# Patient Record
Sex: Male | Born: 1995
Health system: Southern US, Community
[De-identification: ages and names within clinical notes are randomized; demographics above are authoritative.]

---
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
## 2014-03-24 ENCOUNTER — Encounter (HOSPITAL_COMMUNITY): Payer: Self-pay | Admitting: Emergency Medicine

## 2014-03-24 ENCOUNTER — Emergency Department (HOSPITAL_COMMUNITY): Payer: Self-pay

## 2014-03-24 ENCOUNTER — Emergency Department (HOSPITAL_COMMUNITY)
Admission: EM | Admit: 2014-03-24 | Discharge: 2014-03-25 | Disposition: A | Discharge status: Home / Self Care | Payer: Self-pay | Attending: Emergency Medicine | Admitting: Emergency Medicine

## 2014-03-24 DIAGNOSIS — R0789 Other chest pain: Secondary | ICD-10-CM

## 2014-03-24 DIAGNOSIS — R071 Chest pain on breathing: Secondary | ICD-10-CM | POA: Insufficient documentation

## 2014-03-24 DIAGNOSIS — R079 Chest pain, unspecified: Secondary | ICD-10-CM | POA: Insufficient documentation

## 2014-03-24 LAB — CBC WITH DIFFERENTIAL/PLATELET
BASOS ABS: 0 10*3/uL (ref 0.0–0.1)
BASOS PCT: 0 % (ref 0–1)
EOS ABS: 0.1 10*3/uL (ref 0.0–0.7)
Eosinophils Relative: 1 % (ref 0–5)
HCT: 42 % (ref 39.0–52.0)
Hemoglobin: 14.5 g/dL (ref 13.0–17.0)
Lymphocytes Relative: 29 % (ref 12–46)
Lymphs Abs: 2 10*3/uL (ref 0.7–4.0)
MCH: 29.6 pg (ref 26.0–34.0)
MCHC: 34.5 g/dL (ref 30.0–36.0)
MCV: 85.7 fL (ref 78.0–100.0)
Monocytes Absolute: 0.6 10*3/uL (ref 0.1–1.0)
Monocytes Relative: 9 % (ref 3–12)
NEUTROS ABS: 4.3 10*3/uL (ref 1.7–7.7)
NEUTROS PCT: 61 % (ref 43–77)
Platelets: 249 10*3/uL (ref 150–400)
RBC: 4.9 MIL/uL (ref 4.22–5.81)
RDW: 12.4 % (ref 11.5–15.5)
WBC: 7 10*3/uL (ref 4.0–10.5)

## 2014-03-24 LAB — BASIC METABOLIC PANEL
ANION GAP: 12 (ref 5–15)
BUN: 16 mg/dL (ref 6–23)
CHLORIDE: 101 meq/L (ref 96–112)
CO2: 27 mEq/L (ref 19–32)
CREATININE: 0.73 mg/dL (ref 0.50–1.35)
Calcium: 9.5 mg/dL (ref 8.4–10.5)
Glucose, Bld: 102 mg/dL — ABNORMAL HIGH (ref 70–99)
POTASSIUM: 4 meq/L (ref 3.7–5.3)
Sodium: 140 mEq/L (ref 137–147)

## 2014-03-24 LAB — I-STAT TROPONIN, ED: Troponin i, poc: 0 ng/mL (ref 0.00–0.08)

## 2014-03-24 NOTE — ED Notes (Addendum)
Pt presents with chest pain that has been intermittent for the past year, reports some shortness of breath when he is relzxing as well.  Pain is in center of chest onset at present, denies radiation, denies N/V.  Lung sounds clear-  No distress noted in triage.

## 2014-03-25 MED ORDER — ACETAMINOPHEN 325 MG PO TABS
650.0000 mg | ORAL_TABLET | Freq: Once | ORAL | Status: AC
  Administered 2014-03-25: 650 mg via ORAL
  Filled 2014-03-25: qty 2

## 2014-03-25 MED ORDER — GI COCKTAIL ~~LOC~~: 30.0000 mL | Freq: Once | ORAL | Status: DC

## 2014-03-25 NOTE — ED Notes (Signed)
Patient is alert and orientedx4.  Patient was explained discharge instructions and they understood them with no questions.  The patient's mother, Rupert Stackslisa Langworthy is taking the patient home.

## 2014-03-25 NOTE — ED Provider Notes (Signed)
Medical screening examination/treatment/procedure(s) were conducted as a shared visit with non-physician practitioner(s) and myself.  I personally evaluated the patient during the encounter.   EKG Interpretation   Date/Time:  Thursday March 24 2014 20:28:38 EDT Ventricular Rate:  77 PR Interval:  140 QRS Duration: 86 QT Interval:  340 QTC Calculation: 384 R Axis:   99 Text Interpretation:  Normal sinus rhythm with sinus arrhythmia Rightward  axis Borderline ECG Confirmed by Jodi MourningZAVITZ  MD, JOSHUA ([id number]) on 03/24/2014  11:08:42 PM       Doug SouSam Taji Sather, MD 03/25/14 [id number]

## 2014-03-25 NOTE — ED Provider Notes (Signed)
CSN: [id number]     Arrival date & time 03/24/14  2024 History   First MD Initiated Contact with Patient 03/24/14 2346     Chief Complaint  Patient presents with  . Chest Pain     (Consider location/radiation/quality/duration/timing/severity/associated sxs/prior Treatment) HPI  Patient to the ER for evaluation of chest pains that have been coming and going over the past year. He reports waking up this morning with the pain and continued to have it throughout the day. The pain worsens with movement, big breaths and pressure. 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 He has no hx of cardiac dx and is unsure of family history. He feels the pain right under his sternum. He admits to eating a lot of spicy foods. He still reports having mild pain right now in the ED.  History reviewed. No pertinent past medical history. History reviewed. No pertinent past surgical history. No family history on file. History  Substance Use Topics  . Smoking status: Never Smoker   . Smokeless tobacco: Not on file  . Alcohol Use: No    Review of Systems   Review of Systems  Gen: no weight loss, fevers, chills, night sweats  Eyes: no occular draining, occular pain,  No visual changes  Nose: no epistaxis or rhinorrhea  Mouth: no dental pain, no sore throat  Neck: no neck pain  Lungs: No hemoptysis. No wheezing or coughing CV:  No palpitations, dependent edema or orthopnea. + chest pain Abd: no diarrhea. No nausea or vomiting, No abdominal pain  GU: no dysuria or gross hematuria  MSK:  No muscle weakness, No muscular pain Neuro: no headache, no focal neurologic deficits  Skin: no rash , no wounds Psyche: no complaints of depression or anxiety    Allergies  Review of patient's allergies indicates no known allergies.  Home Medications   Prior to Admission medications   Not on File   BP 132/82  Pulse 82  Temp(Src) 98.1 F (36.7 C) (Oral)  Resp  17  Ht 5\' 2"  (1.575 m)  Wt 160 lb (72.576 kg)  BMI 29.26 kg/m2  SpO2 99% Physical Exam  Nursing note and vitals reviewed. Constitutional: He appears well-developed and well-nourished. No distress.  HENT:  Head: Normocephalic and atraumatic.  Eyes: Pupils are equal, round, and reactive to light.  Neck: Normal range of motion. Neck supple.  Cardiovascular: Normal rate and regular rhythm.   Pulmonary/Chest: Effort normal and breath sounds normal. He has no wheezes. He has no rhonchi. He exhibits tenderness (tenderness to palpation of chest wall). He exhibits no bony tenderness and no laceration.    Abdominal: Soft.  Neurological: He is alert.  Skin: Skin is warm and dry.      ED Course  Procedures (including critical care time) Labs Review Labs Reviewed  BASIC METABOLIC PANEL - Abnormal; Notable for the following:    Glucose, Bld 102 (*)    All other components within normal limits  CBC WITH DIFFERENTIAL  Rosezena Sensor, ED    Imaging Review Dg Chest 2 View  03/24/2014   CLINICAL DATA:  Chest pain.  EXAM: CHEST  2 VIEW  COMPARISON:  None.  FINDINGS: The heart size and mediastinal contours are within normal limits. Both lungs are clear. The visualized skeletal structures are unremarkable.  IMPRESSION: No acute cardiopulmonary findings.   Electronically Signed   By: Loralie Champagne M.D.   On: 03/24/2014 22:12     EKG  Interpretation   Date/Time:  Thursday March 24 2014 20:28:38 EDT Ventricular Rate:  77 PR Interval:  140 QRS Duration: 86 QT Interval:  340 QTC Calculation: 384 R Axis:   99 Text Interpretation:  Normal sinus rhythm with sinus arrhythmia Rightward  axis Borderline ECG Confirmed by ZAVITZ  MD, JOSHUA ([id number]) on 03/24/2014  11:08:42 PM      MDM   Final diagnoses:  Chest wall pain    Patient is otherwise a young and healthy male with no cardiac hx. He denies smoking and does not have any cardiac risk factors. He had a chest xray, Troponin, EKG, CBC,  and BMP completed. These showed no abnormal findings to warrant concern for ACS or any other cardiopulmonary etiology. It seems most likely muscular but acid reflux is also a possability. He was given Tylenol in the ED. Dr. Ethelda ChickJacubowitz feels that this is musculoskeletal in nature.   Given [facility]  18 y.o.Austin Mcfarland's evaluation in the Emergency Department is complete. It has been determined that no acute conditions requiring further emergency intervention are present at this time. The patient/guardian have been advised of the diagnosis and plan. We have discussed signs and symptoms that warrant return to the ED, such as changes or worsening in symptoms.  Vital signs are stable at discharge. Filed Vitals:   03/24/14 2032  BP: 132/82  Pulse: 82  Temp: 98.1 F (36.7 C)  Resp: 17    Patient/guardian has voiced understanding and agreed to follow-up with the PCP or specialist.     Dorthula Matasiffany G Prem Coykendall, PA-C 03/25/14 0021

## 2014-03-25 NOTE — ED Provider Notes (Signed)
Complains of anterior chest pain onset one year ago. Became worse today pain is worse when he lifts his arms above his head nonexertional. No shortness of breath nausea or sweatiness. Cardiac risk factors none. Patient alert no distress lungs clear auscultation heart regular rate and rhythm no murmurs rubs chest is tender at left sternal border. Pain is reproducible by forcible abduction of left shoulder. Medical decision-making Symptoms and exam consistent with chest wall pain. Patient has perked negative heart score 0.  chext xray viewed by me. Results for orders placed during the hospital encounter of 03/24/14  CBC WITH DIFFERENTIAL      Result Value Ref Range   WBC 7.0  4.0 - 10.5 K/uL   RBC 4.90  4.22 - 5.81 MIL/uL   Hemoglobin 14.5  13.0 - 17.0 g/dL   HCT 01.042.0  27.239.0 - 53.652.0 %   MCV 85.7  78.0 - 100.0 fL   MCH 29.6  26.0 - 34.0 pg   MCHC 34.5  30.0 - 36.0 g/dL   RDW 64.412.4  03.411.5 - 74.215.5 %   Platelets 249  150 - 400 K/uL   Neutrophils Relative % 61  43 - 77 %   Neutro Abs 4.3  1.7 - 7.7 K/uL   Lymphocytes Relative 29  12 - 46 %   Lymphs Abs 2.0  0.7 - 4.0 K/uL   Monocytes Relative 9  3 - 12 %   Monocytes Absolute 0.6  0.1 - 1.0 K/uL   Eosinophils Relative 1  0 - 5 %   Eosinophils Absolute 0.1  0.0 - 0.7 K/uL   Basophils Relative 0  0 - 1 %   Basophils Absolute 0.0  0.0 - 0.1 K/uL  BASIC METABOLIC PANEL      Result Value Ref Range   Sodium 140  137 - 147 mEq/L   Potassium 4.0  3.7 - 5.3 mEq/L   Chloride 101  96 - 112 mEq/L   CO2 27  19 - 32 mEq/L   Glucose, Bld 102 (*) 70 - 99 mg/dL   BUN 16  6 - 23 mg/dL   Creatinine, Ser 5.950.73  0.50 - 1.35 mg/dL   Calcium 9.5  8.4 - 63.810.5 mg/dL   GFR calc non Af Amer >90  >90 mL/min   GFR calc Af Amer >90  >90 mL/min   Anion gap 12  5 - 15  I-STAT TROPOININ, ED      Result Value Ref Range   Troponin i, poc 0.00  0.00 - 0.08 ng/mL   Comment 3            Dg Chest 2 View  03/24/2014   CLINICAL DATA:  Chest pain.  EXAM: CHEST  2 VIEW   COMPARISON:  None.  FINDINGS: The heart size and mediastinal contours are within normal limits. Both lungs are clear. The visualized skeletal structures are unremarkable.  IMPRESSION: No acute cardiopulmonary findings.   Electronically Signed   By: Loralie ChampagneMark  Gallerani M.D.   On: 03/24/2014 22:12     Doug SouSam Anaclara Acklin, MD 03/25/14 0021

## 2014-03-25 NOTE — Discharge Instructions (Signed)

## 2014-07-25 ENCOUNTER — Emergency Department (HOSPITAL_COMMUNITY): Payer: Self-pay

## 2014-07-25 ENCOUNTER — Emergency Department (HOSPITAL_COMMUNITY)
Admission: EM | Admit: 2014-07-25 | Discharge: 2014-07-25 | Disposition: A | Discharge status: Home / Self Care | Payer: Self-pay | Attending: Emergency Medicine | Admitting: Emergency Medicine

## 2014-07-25 ENCOUNTER — Encounter (HOSPITAL_COMMUNITY): Payer: Self-pay | Admitting: Emergency Medicine

## 2014-07-25 DIAGNOSIS — R52 Pain, unspecified: Secondary | ICD-10-CM

## 2014-07-25 DIAGNOSIS — S3992XA Unspecified injury of lower back, initial encounter: Secondary | ICD-10-CM | POA: Insufficient documentation

## 2014-07-25 DIAGNOSIS — Y9389 Activity, other specified: Secondary | ICD-10-CM | POA: Insufficient documentation

## 2014-07-25 DIAGNOSIS — T148XXA Other injury of unspecified body region, initial encounter: Secondary | ICD-10-CM

## 2014-07-25 DIAGNOSIS — Y9289 Other specified places as the place of occurrence of the external cause: Secondary | ICD-10-CM | POA: Insufficient documentation

## 2014-07-25 DIAGNOSIS — S90512A Abrasion, left ankle, initial encounter: Secondary | ICD-10-CM | POA: Insufficient documentation

## 2014-07-25 DIAGNOSIS — S8012XA Contusion of left lower leg, initial encounter: Secondary | ICD-10-CM | POA: Insufficient documentation

## 2014-07-25 DIAGNOSIS — W102XXA Fall (on)(from) incline, initial encounter: Secondary | ICD-10-CM

## 2014-07-25 DIAGNOSIS — Y990 Civilian activity done for income or pay: Secondary | ICD-10-CM | POA: Insufficient documentation

## 2014-07-25 MED ORDER — HYDROCODONE-ACETAMINOPHEN 5-325 MG PO TABS
1.0000 | ORAL_TABLET | Freq: Once | ORAL | Status: AC
  Administered 2014-07-25: 1 via ORAL
  Filled 2014-07-25: qty 1

## 2014-07-25 MED ORDER — TETANUS-DIPHTH-ACELL PERTUSSIS 5-2.5-18.5 LF-MCG/0.5 IM SUSP
0.5000 mL | Freq: Once | INTRAMUSCULAR | Status: AC
  Administered 2014-07-25: 0.5 mL via INTRAMUSCULAR
  Filled 2014-07-25: qty 0.5

## 2014-07-25 MED ORDER — IBUPROFEN 800 MG PO TABS: 800.0000 mg | ORAL_TABLET | Freq: Three times a day (TID) | ORAL | Status: DC | PRN

## 2014-07-25 MED ORDER — HYDROCODONE-ACETAMINOPHEN 5-325 MG PO TABS: 1.0000 | ORAL_TABLET | ORAL | Status: DC | PRN

## 2014-07-25 NOTE — ED Provider Notes (Signed)
CSN: [id number]     Arrival date & time 07/25/14  1949 History  This chart was scribed for non-physician practitioner working with Audree CamelScott T Goldston, MD by Elveria Risingimelie Horne, ED Scribe. This patient was seen in room TR05C/TR05C and the patient's care was started at 9:02 PM.   Chief Complaint  Patient presents with  . Fall   The history is provided by the patient. No language interpreter was used.   HPI Comments: Austin Mcfarland is a 18 y.o. male who presents to the Emergency Department after a fall on the job this morning. 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 Patient reports that their was small space between the truck and the dock that he fell into. Patient reports hitting his left shin, trapping it in the small space, catching himself with his right leg and hitting his back on a metal railing. Patient denies head injury or loss of consciousness or additional injuries. Patient states that he continued working after his fall. Patient is now complaining of lower back pain and left shin pain. Patient reports bruising, abrasion, and severe pain to his left shin. Patient currently rates his shin pain at 10/10 and his back pain at 8/10.  Shin pain is sharp, constant, nonradiating.  Back pain is sore, constant, worse with movement and palpation, nonradiating.  Denies loss of control of bowel or bladder, weakness of numbness of the extremities, saddle anesthesia, abdominal pain.  History reviewed. No pertinent past medical history. History reviewed. No pertinent past surgical history. No family history on file. History  Substance Use Topics  . Smoking status: Never Smoker   . Smokeless tobacco: Not on file  . Alcohol Use: No    Review of Systems  Constitutional: Negative for fever and chills.  Respiratory: Negative for cough and shortness of breath.   Cardiovascular: Negative for chest pain.  Gastrointestinal: Negative for nausea, vomiting and diarrhea.   Genitourinary: Negative for dysuria and hematuria.  Musculoskeletal: Positive for back pain and arthralgias.  Neurological: Negative for weakness and numbness.   Allergies  Review of patient's allergies indicates no known allergies.  Home Medications   Prior to Admission medications   Not on File   Triage Vitals: BP 123/66 mmHg  Pulse 72  Temp(Src) 98.6 F (37 C) (Oral)  Resp 18  SpO2 100% Physical Exam  Constitutional: He appears well-developed and well-nourished. No distress.  HENT:  Head: Normocephalic and atraumatic.  Neck: Neck supple.  Pulmonary/Chest: Effort normal.  Abdominal: Soft. He exhibits no distension. There is no tenderness. There is no rebound and no guarding.  Musculoskeletal:  Spine nontender with exception of lumbar sine, no crepitus, or stepoffs. Midline tenderness to lumbar spine. No Crepitus or step offs.  Lower extremities:  Strength 5/5, sensation intact, distal pulses intact.    Normal active ROM of left ankle left toes. Distal sensation intact. No bony tenderness to foot or ankle. Abrasion to the medial ankle with surrounding ecchymosis. Contusion approximately 8 x 3cm over his shin without surrounding mild ecchymosis and tenderness throughout mid anterior shin.   Neurological: He is alert.  Skin: He is not diaphoretic.  Nursing note and vitals reviewed.   ED Course  Procedures (including critical care time)  COORDINATION OF CARE: 9:02 PM- Discussed treatment plan with patient at bedside and patient agreed to plan.   Labs Review Labs Reviewed - No data to display  Imaging Review Dg Lumbar Spine Complete  07/25/2014  CLINICAL DATA:  Initial evaluation for acute trauma. Fall. Lower back pain.  EXAM: LUMBAR SPINE - COMPLETE 4+ VIEW  COMPARISON:  Prior study from 03/28/2011  FINDINGS: Extent distal lumbosacral anatomy noted. Vertebral bodies are normally aligned with preservation of the normal lumbar lordosis. Vertebral body heights maintained.  No acute fracture or listhesis.  No significant degenerative changes.  No paraspinous soft tissue abnormality.  IMPRESSION: No acute traumatic injury within the lumbar spine.   Electronically Signed   By: Rise MuBenjamin  McClintock M.D.   On: 07/25/2014 22:34   Dg Tibia/fibula Left  07/25/2014   CLINICAL DATA:  Fall today. Left anterior lower leg pain with contusion.  EXAM: LEFT TIBIA AND FIBULA - 2 VIEW  COMPARISON:  None.  FINDINGS: The mineralization and alignment are normal. There is no evidence of acute fracture or dislocation. There appears to be mild pretibial soft tissue swelling. No foreign bodies or soft tissue emphysema demonstrated.  IMPRESSION: Mild pretibial soft tissue swelling.  No acute osseous findings.   Electronically Signed   By: Roxy HorsemanBill  Veazey M.D.   On: 07/25/2014 22:28     EKG Interpretation None      MDM   Final diagnoses:  Pain  Contusion  Fall (on)(from) incline, initial encounter    Afebrile, nontoxic patient with hx fall this morning with contusion of left shin, pain in low back as well.  No red flags for back pain.  Xrays negative.   D/C home with ibuprofen, norco, ace wrap, RICE tx recommendations.  Discussed result, findings, treatment, and follow up  with patient.  Pt given return precautions.  Pt verbalizes understanding and agrees with plan.       I personally performed the services described in this documentation, which was scribed in my presence. The recorded information has been reviewed and is accurate.    Trixie Dredgemily Addilynn Mowrer, PA-C 07/25/14 2250  Audree CamelScott T Goldston, MD 07/26/14 [phone]

## 2014-07-25 NOTE — ED Notes (Signed)
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

## 2014-07-25 NOTE — Discharge Instructions (Signed)
Read the information below.  Use the prescribed medication as directed.  Please discuss all new medications with your pharmacist.  Do not take additional tylenol while taking the prescribed pain medication to avoid overdose.  You may return to the Emergency Department at any time for worsening condition or any new symptoms that concern you.  If you develop uncontrolled pain, weakness or numbness of the extremity, severe discoloration of the skin, or you are unable to walk, return to the ER for a recheck.      Contusion A contusion is the result of an injury to the skin and underlying tissues and is usually caused by direct trauma. The injury results in the appearance of a bruise on the skin overlying the injured tissues. Contusions cause rupture and bleeding of the small capillaries and blood vessels and affect function, because the bleeding infiltrates muscles, tendons, nerves, or other soft tissues.  SYMPTOMS   Swelling and often a hard lump in the injured area, either superficial or deep.  Pain and tenderness over the area of the contusion.  Feeling of firmness when pressure is exerted over the contusion.  Discoloration under the skin, beginning with redness and progressing to the characteristic "black and blue" bruise. CAUSES  A contusion is typically the result of direct trauma. This is often by a blunt object.  RISK INCREASES WITH:  Sports that have a high likelihood of trauma (football, boxing, ice hockey, soccer, field hockey, martial arts, basketball, and baseball).  Sports that make falling from a height likely (high-jumping, pole-vaulting, skating, or gymnastics).  Any bleeding disorder (hemophilia) or taking medications that affect clotting (aspirin, nonsteroidal anti-inflammatory medications, or warfarin [Coumadin]).  Inadequate protection of exposed areas during contact sports. PREVENTION  Maintain physical fitness:  Joint and muscle flexibility.  Strength and  endurance.  Coordination.  Wear proper protective equipment. Make sure it fits correctly. PROGNOSIS  Contusions typically heal without any complications. Healing time varies with the severity of injury and intake of medications that affect clotting. Contusions usually heal in 1 to 4 weeks. RELATED COMPLICATIONS   Damage to nearby nerves or blood vessels, causing numbness, coldness, or paleness.  Compartment syndrome.  Bleeding into the soft tissues that leads to disability.  Infiltrative-type bleeding, leading to the calcification and impaired function of the injured muscle (rare).  Prolonged healing time if usual activities are resumed too soon.  Infection if the skin over the injury site is broken.  Fracture of the bone underlying the contusion.  Stiffness in the joint where the injured muscle crosses. TREATMENT  Treatment initially consists of resting the injured area as well as medication and ice to reduce inflammation. The use of a compression bandage may also be helpful in minimizing inflammation. As pain diminishes and movement is tolerated, the joint where the affected muscle crosses should be moved to prevent stiffness and the shortening (contracture) of the joint. Movement of the joint should begin as soon as possible. It is also important to work on maintaining strength within the affected muscles. Occasionally, extra padding over the area of contusion may be recommended before returning to sports, particularly if re-injury is likely.  MEDICATION   If pain relief is necessary these medications are often recommended:  Nonsteroidal anti-inflammatory medications, such as aspirin and ibuprofen.  Other minor pain relievers, such as acetaminophen, are often recommended.  Prescription pain relievers may be given by your caregiver. Use only as directed and only as much as you need. HEAT AND COLD  Cold treatment (  icing) relieves pain and reduces inflammation. Cold treatment  should be applied for 10 to 15 minutes every 2 to 3 hours for inflammation and pain and immediately after any activity that aggravates your symptoms. Use ice packs or an ice massage. (To do an ice massage fill a large styrofoam cup with water and freeze. Tear a small amount of foam from the top so ice protrudes. Massage ice firmly over the injured area in a circle about the size of a softball.)  Heat treatment may be used prior to performing the stretching and strengthening activities prescribed by your caregiver, physical therapist, or athletic trainer. Use a heat pack or a warm soak. SEEK MEDICAL CARE IF:   Symptoms get worse or do not improve despite treatment in a few days.  You have difficulty moving a joint.  Any extremity becomes extremely painful, numb, pale, or cool (This is an emergency!).  Medication produces any side effects (bleeding, upset stomach, or allergic reaction).  Signs of infection (drainage from skin, headache, muscle aches, dizziness, fever, or general ill feeling) occur if skin was broken. Document Released: 07/22/2005 Document Revised: 10/14/2011 Document Reviewed: 11/03/2008 [facility] Patient Information 2015 [place]. This information is not intended to replace advice given to you by your health care provider. Make sure you discuss any questions you have with your health care provider.

## 2015-09-13 ENCOUNTER — Encounter (HOSPITAL_COMMUNITY): Payer: Self-pay | Admitting: Family Medicine

## 2015-09-13 ENCOUNTER — Emergency Department (HOSPITAL_COMMUNITY)
Admission: EM | Admit: 2015-09-13 | Discharge: 2015-09-13 | Disposition: A | Discharge status: Home / Self Care | Payer: Self-pay | Attending: Emergency Medicine | Admitting: Emergency Medicine

## 2015-09-13 DIAGNOSIS — M62830 Muscle spasm of back: Secondary | ICD-10-CM | POA: Insufficient documentation

## 2015-09-13 DIAGNOSIS — M25562 Pain in left knee: Secondary | ICD-10-CM | POA: Insufficient documentation

## 2015-09-13 DIAGNOSIS — M62838 Other muscle spasm: Secondary | ICD-10-CM

## 2015-09-13 DIAGNOSIS — Z87828 Personal history of other (healed) physical injury and trauma: Secondary | ICD-10-CM | POA: Insufficient documentation

## 2015-09-13 MED ORDER — CYCLOBENZAPRINE HCL 10 MG PO TABS: 10.0000 mg | ORAL_TABLET | Freq: Two times a day (BID) | ORAL | Status: DC | PRN

## 2015-09-13 MED ORDER — IBUPROFEN 800 MG PO TABS: 800.0000 mg | ORAL_TABLET | Freq: Three times a day (TID) | ORAL | Status: DC

## 2015-09-13 MED FILL — IBUPROFEN 800 MG TABLET: 800 | 7 days supply | Qty: 21 | Fill #0

## 2015-09-13 MED FILL — CYCLOBENZAPRINE 10 MG TAB: 10 | 10 days supply | Qty: 20 | Fill #0

## 2015-09-13 NOTE — ED Notes (Signed)
Pt here for lower back pain. sts he has had 2 injuries at work and then became worse after playing soccer the other day.

## 2015-09-13 NOTE — Discharge Instructions (Signed)
Lumbosacral Strain Lumbosacral strain is a strain of any of the parts that make up your lumbosacral vertebrae. Your lumbosacral vertebrae are the bones that make up the lower third of your backbone. Your lumbosacral vertebrae are held together by muscles and tough, fibrous tissue (ligaments).  CAUSES  A sudden blow to your back can cause lumbosacral strain. Also, anything that causes an excessive stretch of the muscles in the low back can cause this strain. This is typically seen when people exert themselves strenuously, fall, lift heavy objects, bend, or crouch repeatedly. RISK FACTORS  Physically demanding work.  Participation in pushing or pulling sports or sports that require a sudden twist of the back (tennis, golf, baseball).  Weight lifting.  Excessive lower back curvature.  Forward-tilted pelvis.  Weak back or abdominal muscles or both.  Tight hamstrings. SIGNS AND SYMPTOMS  Lumbosacral strain may cause pain in the area of your injury or pain that moves (radiates) down your leg.  DIAGNOSIS Your health care provider can often diagnose lumbosacral strain through a physical exam. In some cases, you may need tests such as X-ray exams.  TREATMENT  Treatment for your lower back injury depends on many factors that your clinician will have to evaluate. However, most treatment will include the use of anti-inflammatory medicines. HOME CARE INSTRUCTIONS   Avoid hard physical activities (tennis, racquetball, waterskiing) if you are not in proper physical condition for it. This may aggravate or create problems.  If you have a back problem, avoid sports requiring sudden body movements. Swimming and walking are generally safer activities.  Maintain good posture.  Maintain a healthy weight.  For acute conditions, you may put ice on the injured area.  Put ice in a plastic bag.  Place a towel between your skin and the bag.  Leave the ice on for 20 minutes, 2-3 times a day.  When the  low back starts healing, stretching and strengthening exercises may be recommended. SEEK MEDICAL CARE IF:  Your back pain is getting worse.  You experience severe back pain not relieved with medicines. SEEK IMMEDIATE MEDICAL CARE IF:   You have numbness, tingling, weakness, or problems with the use of your arms or legs.  There is a change in bowel or bladder control.  You have increasing pain in any area of the body, including your belly (abdomen).  You notice shortness of breath, dizziness, or feel faint.  You feel sick to your stomach (nauseous), are throwing up (vomiting), or become sweaty.  You notice discoloration of your toes or legs, or your feet get very cold. MAKE SURE YOU:   Understand these instructions.  Will watch your condition.  Will get help right away if you are not doing well or get worse.   This information is not intended to replace advice given to you by your health care provider. Make sure you discuss any questions you have with your health care provider.   Document Released: 05/01/2005 Document Revised: 08/12/2014 Document Reviewed: 03/10/2013 Elsevier Interactive Patient Education 2016 Else[employer] Muscle Cramps and Spasms Muscle cramps and spasms occur when a muscle or muscles tighten and you have no control over this tightening (involuntary muscle contraction). They are a common problem and can develop in any muscle. The most common place is in the calf muscles of the leg. Both muscle cramps and muscle spasms are involuntary muscle contractions, but they also have differences:   Muscle cramps are sporadic and painful. They may last a few seconds to a quarter  of an hour. Muscle cramps are often more forceful and last longer than muscle spasms.  Muscle spasms may or may not be painful. They may also last just a few seconds or much longer. CAUSES  It is uncommon for cramps or spasms to be due to a serious underlying problem. In many cases, the cause of  cramps or spasms is unknown. Some common causes are:   Overexertion.   Overuse from repetitive motions (doing the same thing over and over).   Remaining in a certain position for a long period of time.   Improper preparation, form, or technique while performing a sport or activity.   Dehydration.   Injury.   Side effects of some medicines.   Abnormally low levels of the salts and ions in your blood (electrolytes), especially potassium and calcium. This could happen if you are taking water pills (diuretics) or you are pregnant.  Some underlying medical problems can make it more likely to develop cramps or spasms. These include, but are not limited to:   Diabetes.   Parkinson disease.   Hormone disorders, such as thyroid problems.   Alcohol abuse.   Diseases specific to muscles, joints, and bones.   Blood vessel disease where not enough blood is getting to the muscles.  HOME CARE INSTRUCTIONS   Stay well hydrated. Drink enough water and fluids to keep your urine clear or pale yellow.  It may be helpful to massage, stretch, and relax the affected muscle.  For tight or tense muscles, use a warm towel, heating pad, or hot shower water directed to the affected area.  If you are sore or have pain after a cramp or spasm, applying ice to the affected area may relieve discomfort.  Put ice in a plastic bag.  Place a towel between your skin and the bag.  Leave the ice on for 15-20 minutes, 03-04 times a day.  Medicines used to treat a known cause of cramps or spasms may help reduce their frequency or severity. Only take over-the-counter or prescription medicines as directed by your caregiver. SEEK MEDICAL CARE IF:  Your cramps or spasms get more severe, more frequent, or do not improve over time.  MAKE SURE YOU:   Understand these instructions.  Will watch your condition.  Will get help right away if you are not doing well or get worse.   This information is  not intended to replace advice given to you by your health care provider. Make sure you discuss any questions you have with your health care provider.   Document Released: 01/11/2002 Document Revised: 11/16/2012 Document Reviewed: 07/08/2012 Elsevier Interactive Patient Education [employer]

## 2015-09-13 NOTE — ED Provider Notes (Signed)
CSN: [id number]     Arrival date & time 09/13/15  1100 History  By signing my name below, I, Soijett Blue, attest that this documentation has been prepared under the direction and in the presence of Roxy Horseman, PA-C Electronically Signed: Soijett Blue, ED Scribe. 09/13/2015. 12:16 PM.   Chief Complaint  Patient presents with  . Back Pain      The history is provided by the patient. No language interpreter was used.    HPI Comments: Austin Mcfarland is a 20 y.o. male who presents to the Emergency Department complaining of gradual onset left lower back pain onset yesterday. Pt notes that he injured his back a long time ago and that his pain has gradually increased. He reports that the back pain does not radiate at this time. He states that he is having associated symptoms of left knee pain pain that is worsened with bending. He states that he has not tried any medications for the relief for his symptoms. Pt denies bowel/bladder incontinence, and any other symptoms. Denies allergies to medications. Pt works Tree surgeon.  Per pr chart review: Pt was seen in the ED on 07/25/2014 for a fall due to pt unloading a truck and falling into the small space between the truck and loading dock. Pt had negative L-spine xray and left tib/fib xray that showed mild pretibial soft tissues swelling. Pt was Rx ibuprofen, norco, ace wrap and informed to use the RICE treatment.  History reviewed. No pertinent past medical history. History reviewed. No pertinent past surgical history. History reviewed. No pertinent family history. Social History  Substance Use Topics  . Smoking status: Never Smoker   . Smokeless tobacco: None  . Alcohol Use: No    Review of Systems  Gastrointestinal:       No bowel incontinence  Genitourinary:       No bladder incontinence  Musculoskeletal: Positive for back pain and arthralgias. Negative for gait problem.  Skin: Negative for color change, rash and wound.       Allergies  Review of patient's allergies indicates no known allergies.  Home Medications   Prior to Admission medications   Medication Sig Start Date End Date Taking? Authorizing Provider  HYDROcodone-acetaminophen (NORCO/VICODIN) 5-325 MG per tablet Take 1 tablet by mouth every 4 (four) hours as needed for moderate pain or severe pain. 07/25/14   Trixie Dredge, PA-C  ibuprofen (ADVIL,MOTRIN) 800 MG tablet Take 1 tablet (800 mg total) by mouth every 8 (eight) hours as needed for mild pain or moderate pain. 07/25/14   Trixie Dredge, PA-C   BP 113/62 mmHg  Pulse 67  Temp(Src) 98.1 F (36.7 C) (Oral)  Resp 18  SpO2 100% Physical Exam  Constitutional: He is oriented to person, place, and time. He appears well-developed and well-nourished. No distress.  HENT:  Head: Normocephalic and atraumatic.  Eyes: Conjunctivae and EOM are normal. Right eye exhibits no discharge. Left eye exhibits no discharge. No scleral icterus.  Neck: Normal range of motion. Neck supple. No tracheal deviation present.  Cardiovascular: Normal rate, regular rhythm and normal heart sounds.  Exam reveals no gallop and no friction rub.   No murmur heard. Pulmonary/Chest: Effort normal and breath sounds normal. No respiratory distress. He has no wheezes.  Abdominal: Soft. He exhibits no distension. There is no tenderness.  Musculoskeletal: Normal range of motion.  Lumbar paraspinal muscles tender to palpation, no bony tenderness, step-offs, or gross abnormality or deformity of spine, patient is able to ambulate, moves  all extremities  Bilateral great toe extension intact Bilateral plantar/dorsiflexion intact  Neurological: He is alert and oriented to person, place, and time. He has normal reflexes.  Sensation and strength intact bilaterally Symmetrical reflexes  Skin: Skin is warm and dry. He is not diaphoretic.  Psychiatric: He has a normal mood and affect. His behavior is normal. Judgment and thought content  normal.  Nursing note and vitals reviewed.   ED Course  Procedures (including critical care time) DIAGNOSTIC STUDIES: Oxygen Saturation is 100% on RA, nl by my interpretation.    COORDINATION OF CARE: 12:14 PM Discussed treatment plan with pt at bedside which includes referral to back specialist and pt agreed to plan.   Labs Review Labs Reviewed - No data to display  Imaging Review No results found.    EKG Interpretation None      MDM   Final diagnoses:  Muscle spasm    Patient with back pain, likely lumbar strain vs muscle spasm.  No neurological deficits and normal neuro exam.  Patient is ambulatory.  No loss of bowel or bladder control.  Doubt cauda equina.  Denies fever,  doubt epidural abscess or other lesion. Recommend back exercises, stretching, RICE, and will treat with a short course of flexeril, ibuprofen.  Encouraged the patient that there could be a need for additional workup and/or imaging such as MRI, if the symptoms do not resolve. Patient advised that if the back pain does not resolve, or radiates, this could progress to more serious conditions and is encouraged to follow-up with PCP or orthopedics within 2 weeks.     I personally performed the services described in this documentation, which was scribed in my presence. The recorded information has been reviewed and is accurate.      Roxy Horseman, PA-C 09/13/15 1225  Tilden Fossa, MD 09/14/15 [phone]

## 2015-10-18 ENCOUNTER — Emergency Department (HOSPITAL_COMMUNITY): Payer: Self-pay

## 2015-10-18 ENCOUNTER — Emergency Department (HOSPITAL_COMMUNITY)
Admission: EM | Admit: 2015-10-18 | Discharge: 2015-10-18 | Disposition: A | Discharge status: Home / Self Care | Payer: Self-pay | Attending: Emergency Medicine | Admitting: Emergency Medicine

## 2015-10-18 ENCOUNTER — Encounter (HOSPITAL_COMMUNITY): Payer: Self-pay | Admitting: Family Medicine

## 2015-10-18 DIAGNOSIS — R202 Paresthesia of skin: Secondary | ICD-10-CM | POA: Insufficient documentation

## 2015-10-18 DIAGNOSIS — Z791 Long term (current) use of non-steroidal anti-inflammatories (NSAID): Secondary | ICD-10-CM | POA: Insufficient documentation

## 2015-10-18 DIAGNOSIS — M545 Low back pain: Secondary | ICD-10-CM | POA: Insufficient documentation

## 2015-10-18 DIAGNOSIS — R200 Anesthesia of skin: Secondary | ICD-10-CM | POA: Insufficient documentation

## 2015-10-18 MED ORDER — DICLOFENAC SODIUM 50 MG PO TBEC: 50.0000 mg | DELAYED_RELEASE_TABLET | Freq: Two times a day (BID) | ORAL | Status: DC

## 2015-10-18 MED ORDER — METHOCARBAMOL 500 MG PO TABS: 500.0000 mg | ORAL_TABLET | Freq: Two times a day (BID) | ORAL | Status: DC

## 2015-10-18 NOTE — ED Notes (Signed)
Pt here for lower back pain and sts has been seen here for the same in the past. sts meds not helping.

## 2015-10-18 NOTE — ED Provider Notes (Signed)
CSN: [id number]     Arrival date & time 10/18/15  1311 History   By signing my name below, I, Emmanuella Mensah, attest that this documentation has been prepared under the direction and in the presence of Cheron SchaumannLeslie Marshun Duva, PA-C. Electronically Signed: Angelene GiovanniEmmanuella Mensah, ED Scribe. 10/18/2015. 2:36 PM.     Chief Complaint  Patient presents with  . Back Pain   Patient is a 20 y.o. male presenting with back pain. The history is provided by the patient. No language interpreter was used.  Back Pain Location:  Lumbar spine Quality:  Unable to specify Radiates to:  Does not radiate Pain severity:  Moderate Pain is:  Same all the time Onset quality:  Gradual Duration:  1 month Timing:  Constant Progression:  Worsening Chronicity:  Chronic Context: falling   Relieved by:  Nothing Worsened by:  Bending Ineffective treatments:  Ibuprofen and muscle relaxants Associated symptoms: numbness (lower back) and tingling   Associated symptoms: no bladder incontinence, no bowel incontinence, no dysuria and no weakness    HPI Comments: Austin Mcfarland is a 20 y.o. male who presents to the Emergency Department complaining of gradually worsening constant non-radiating lower back pain onset 09/13/15. He reports associated numbness/tingling in his lower back. He states that the pain is worse with bending, flexing, and lifting objects. He explains that he fell while he was at work hitting his back on a metal object prompting him to visit the ED on 09/13/15, but then 2-3 weeks later, he fell again, hitting his back on a ladder. He states that he is here because his pain is progressing even with the Flexeril and Ibuprofen. He denies any bilateral leg pain, weakness, numbness in toes, bowel/bladder incontinence, urinary symptoms, or n/v.    History reviewed. No pertinent past medical history. History reviewed. No pertinent past surgical history. History reviewed. No pertinent family history. Social History  Substance  Use Topics  . Smoking status: Never Smoker   . Smokeless tobacco: None  . Alcohol Use: No    Review of Systems  Gastrointestinal: Negative for nausea, vomiting and bowel incontinence.  Genitourinary: Negative for bladder incontinence, dysuria, frequency and hematuria.  Musculoskeletal: Positive for back pain.  Neurological: Positive for tingling and numbness (lower back). Negative for weakness.      Allergies  Review of patient's allergies indicates no known allergies.  Home Medications   Prior to Admission medications   Medication Sig Start Date End Date Taking? Authorizing Provider  cyclobenzaprine (FLEXERIL) 10 MG tablet Take 1 tablet (10 mg total) by mouth 2 (two) times daily as needed for muscle spasms. 09/13/15   Roxy Horsemanobert Browning, PA-C  ibuprofen (ADVIL,MOTRIN) 800 MG tablet Take 1 tablet (800 mg total) by mouth 3 (three) times daily. 09/13/15   Roxy Horsemanobert Browning, PA-C   BP 135/82 mmHg  Pulse 83  Temp(Src) 97.9 F (36.6 C) (Oral)  Resp 18  Ht 5\' 1"  (1.549 m)  Wt 180 lb (81.647 kg)  BMI 34.03 kg/m2  SpO2 99% Physical Exam  Constitutional: He is oriented to person, place, and time. He appears well-developed and well-nourished. No distress.  HENT:  Head: Normocephalic and atraumatic.  Eyes: Conjunctivae and EOM are normal.  Neck: Neck supple. No tracheal deviation present.  Cardiovascular: Normal rate.   Pulmonary/Chest: Effort normal. No respiratory distress.  Musculoskeletal: Normal range of motion.  Diffusely tender lumber sacral spine L5, S1 and paravertebral Negative straight leg raise Strength and reflexes intact  Neurological: He is alert and oriented to person,  place, and time.  Skin: Skin is warm and dry.  Psychiatric: He has a normal mood and affect. His behavior is normal.  Nursing note and vitals reviewed.   ED Course  Procedures (including critical care time) DIAGNOSTIC STUDIES: Oxygen Saturation is 99% on RA, normal by my interpretation.     COORDINATION OF CARE: 2:26 PM- Pt advised of plan for treatment and pt agrees. Pt will receive x-ray for further evaluation.   Imaging Review No results found.   Cheron Schaumann, PA-C has personally reviewed and evaluated these images as part of her medical decision-making.  MDM   Final diagnoses:  Low back pain, unspecified back pain laterality, with sciatica presence unspecified   Meds ordered this encounter  Medications  . methocarbamol (ROBAXIN) 500 MG tablet    Sig: Take 1 tablet (500 mg total) by mouth 2 (two) times daily.    Dispense:  20 tablet    Refill:  0    Order Specific Question:  Supervising Provider    Answer:  MILLER, BRIAN [[id number]]  . diclofenac (VOLTAREN) 50 MG EC tablet    Sig: Take 1 tablet (50 mg total) by mouth 2 (two) times daily.    Dispense:  20 tablet    Refill:  0    Order Specific Question:  Supervising Provider    Answer:  Eber Hong [[id number]]    An After Visit Summary was printed and given to the patient.  Austin Skinner [place], PA-C 10/18/15 1548  Gwyneth Sprout, MD 10/19/15 [phone]

## 2015-10-18 NOTE — Discharge Instructions (Signed)

## 2015-10-18 NOTE — ED Notes (Signed)
Declined W/C at D/C and was escorted to lobby by RN. 

## 2015-10-27 MED FILL — DICLOFENAC SOD EC 50 MG TAB: 50 | 10 days supply | Qty: 20 | Fill #0

## 2015-10-27 MED FILL — METHOCARBAMOL 500 MG TABLET: 500 | 10 days supply | Qty: 20 | Fill #0

## 2017-09-07 ENCOUNTER — Encounter (HOSPITAL_COMMUNITY): Payer: Self-pay

## 2017-09-07 ENCOUNTER — Other Ambulatory Visit: Payer: Self-pay

## 2017-09-07 ENCOUNTER — Emergency Department (HOSPITAL_COMMUNITY)
Admission: EM | Admit: 2017-09-07 | Discharge: 2017-09-07 | Disposition: A | Discharge status: Home / Self Care | Payer: Self-pay | Attending: Emergency Medicine | Admitting: Emergency Medicine

## 2017-09-07 DIAGNOSIS — Y9389 Activity, other specified: Secondary | ICD-10-CM | POA: Insufficient documentation

## 2017-09-07 DIAGNOSIS — Y92410 Unspecified street and highway as the place of occurrence of the external cause: Secondary | ICD-10-CM | POA: Insufficient documentation

## 2017-09-07 DIAGNOSIS — M545 Low back pain, unspecified: Secondary | ICD-10-CM

## 2017-09-07 DIAGNOSIS — Z79899 Other long term (current) drug therapy: Secondary | ICD-10-CM | POA: Insufficient documentation

## 2017-09-07 DIAGNOSIS — Y999 Unspecified external cause status: Secondary | ICD-10-CM | POA: Insufficient documentation

## 2017-09-07 MED ORDER — CYCLOBENZAPRINE HCL 10 MG PO TABS: 10.0000 mg | ORAL_TABLET | Freq: Two times a day (BID) | ORAL | 0 refills | Status: DC | PRN
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
## 2017-09-07 NOTE — Discharge Instructions (Signed)
Please take ibuprofen 600 mg every 6 hours as needed for pain.  Also apply heat to the low back to help with your symptoms.  I have written you a prescription for a muscle relaxer medicine called flexeril. This medicine can make you drowsy so please do not drive, work or drink alcohol while taking it.   I have written you a work note.   Please no heavy lifting >25lbs for a week.   If you are still having pain in a week, please contact your regular doctor for follow up.   Return to the ER if you experience blood in your urine, have stomach pain with vomiting or have any new or worsening symptoms.

## 2017-09-07 NOTE — ED Notes (Signed)
Declined W/C at D/C and was escorted to lobby by RN. 

## 2018-03-18 ENCOUNTER — Other Ambulatory Visit: Payer: Self-pay | Admitting: Gerontology

## 2018-03-18 ENCOUNTER — Ambulatory Visit (INDEPENDENT_AMBULATORY_CARE_PROVIDER_SITE_OTHER): Payer: Self-pay

## 2018-03-18 DIAGNOSIS — M79641 Pain in right hand: Secondary | ICD-10-CM

## 2020-01-17 ENCOUNTER — Encounter (HOSPITAL_COMMUNITY): Payer: Self-pay

## 2020-01-17 ENCOUNTER — Other Ambulatory Visit: Payer: Self-pay

## 2020-01-17 ENCOUNTER — Ambulatory Visit (HOSPITAL_COMMUNITY)
Admission: EM | Admit: 2020-01-17 | Discharge: 2020-01-17 | Disposition: A | Discharge status: Home / Self Care | Payer: Self-pay | Attending: Internal Medicine | Admitting: Internal Medicine

## 2020-01-17 DIAGNOSIS — L255 Unspecified contact dermatitis due to plants, except food: Secondary | ICD-10-CM

## 2020-01-17 MED ORDER — METHYLPREDNISOLONE SODIUM SUCC 125 MG IJ SOLR
60.0000 mg | Freq: Once | INTRAMUSCULAR | Status: AC
  Administered 2020-01-17: 60 mg via INTRAMUSCULAR

## 2020-01-17 MED ORDER — METHYLPREDNISOLONE SODIUM SUCC 125 MG IJ SOLR
INTRAMUSCULAR | Status: AC
  Filled 2020-01-17: qty 2

## 2020-01-17 MED ORDER — HYDROXYZINE HCL 25 MG PO TABS: 25.0000 mg | ORAL_TABLET | Freq: Three times a day (TID) | ORAL | 0 refills | Status: DC | PRN

## 2020-01-17 MED ORDER — PREDNISONE 20 MG PO TABS
20.0000 mg | ORAL_TABLET | Freq: Every day | ORAL | 0 refills | Status: AC
Start: 2020-01-17 — End: 2020-01-22

## 2020-01-17 NOTE — ED Triage Notes (Addendum)
Pt is here with a rash that has spread all over his body this started Saturday, pt states on Friday he was cleaning off some vines at work. Pt has taken Ivarest to relieve discomfort.

## 2020-02-03 IMAGING — DX DG HAND COMPLETE 3+V*R*
3 series · 3 of 3 positions shown · non-contrast
Comparison: None.

CLINICAL DATA: Acute RIGHT hand injury and pain today. Initial
encounter.

EXAM:
RIGHT HAND - COMPLETE 3+ VIEW

[hand pa]
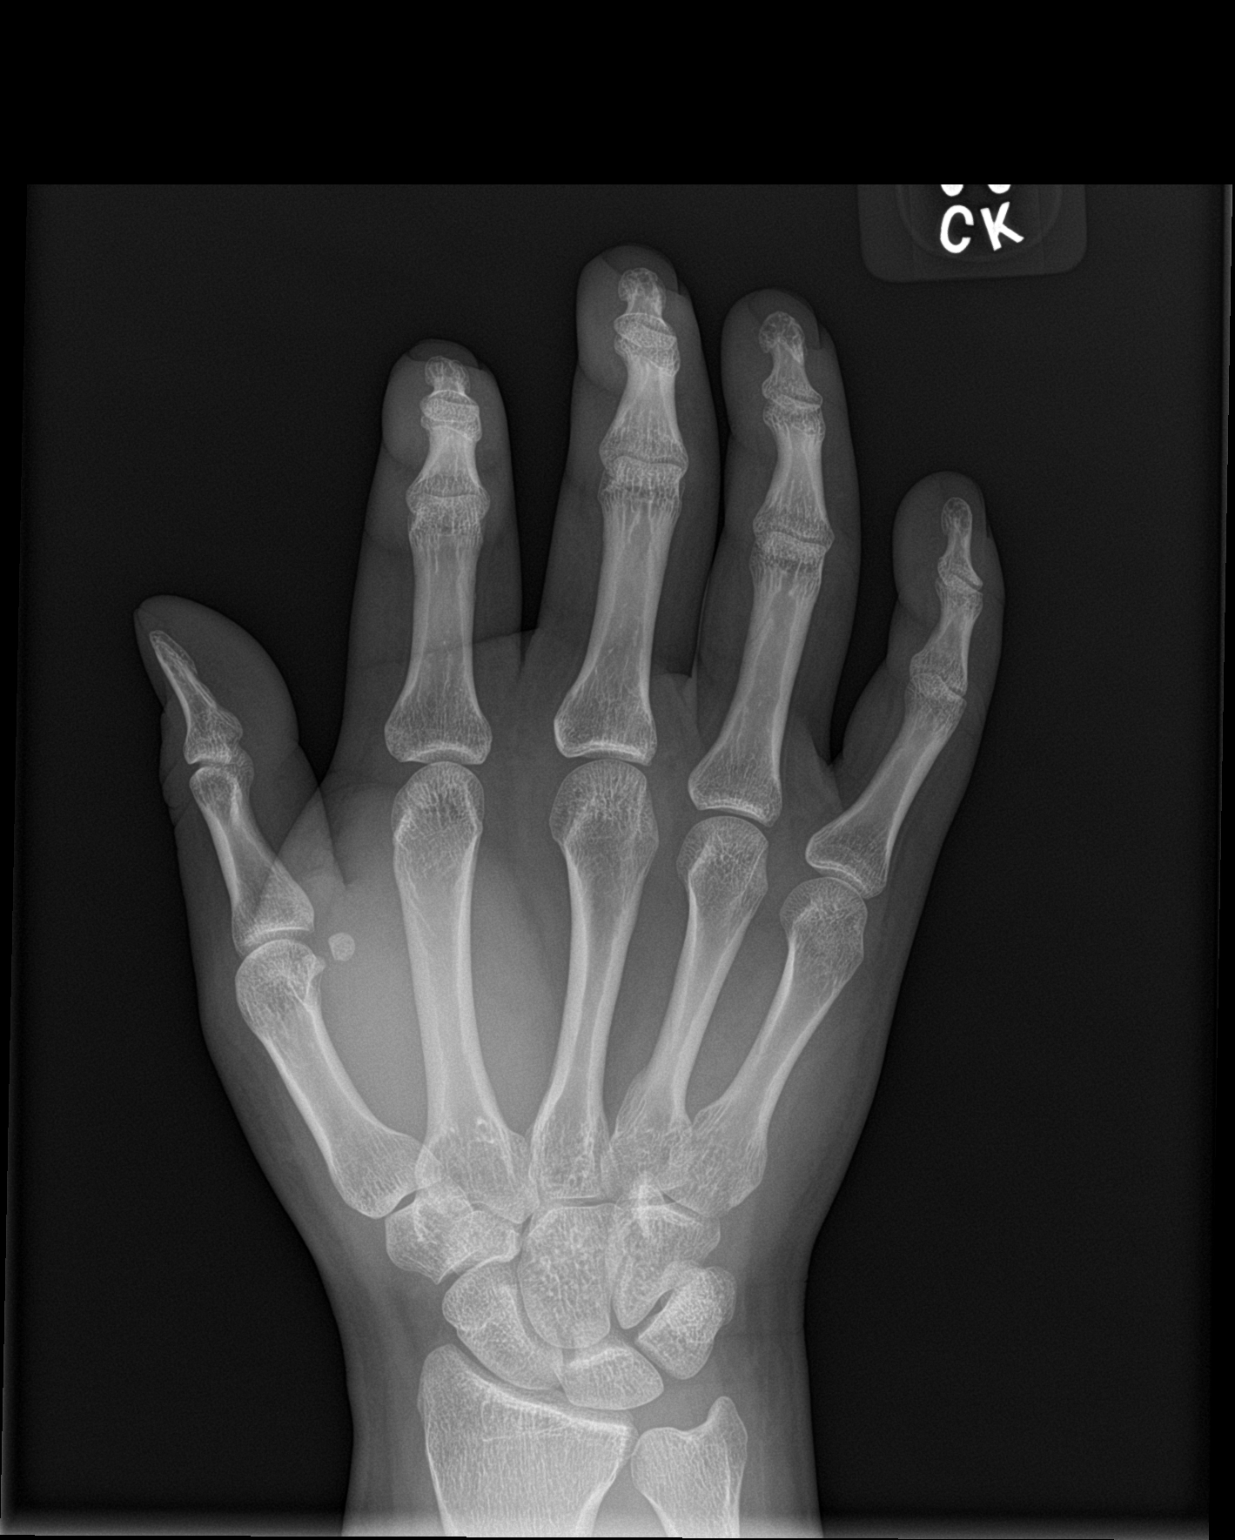

[hand obl]
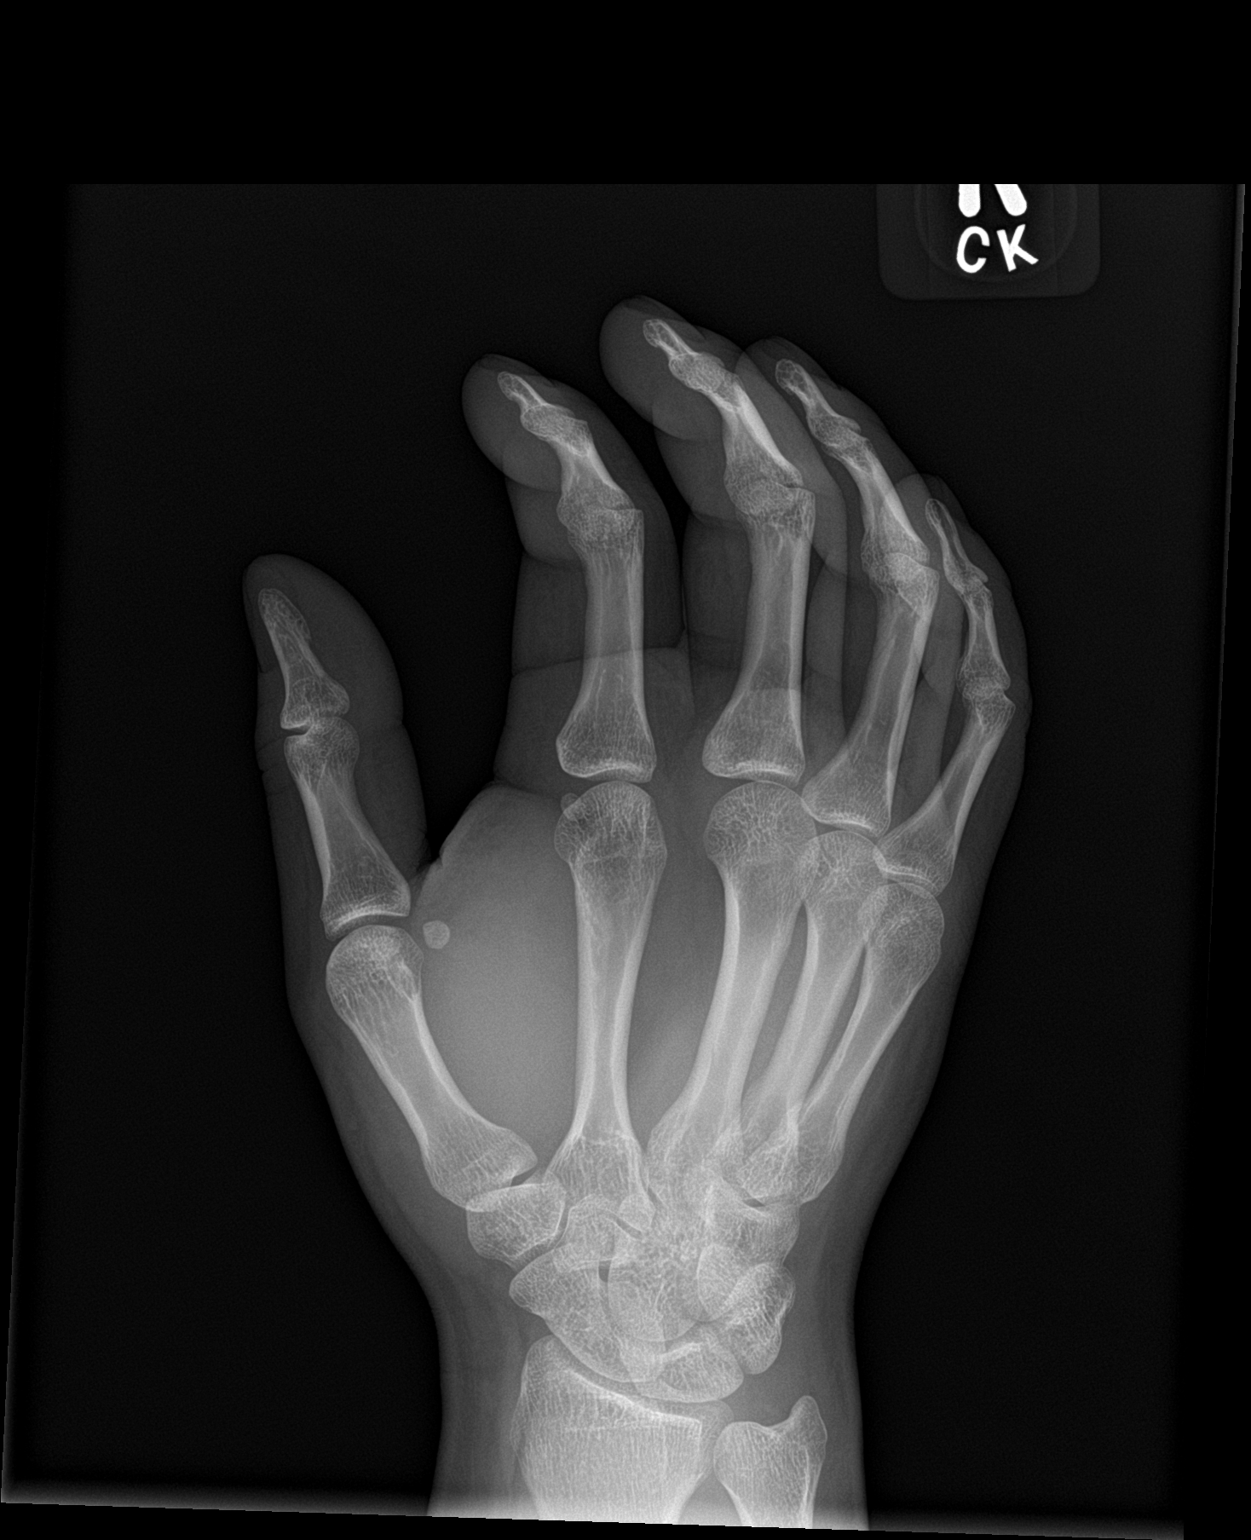

[hand lat]
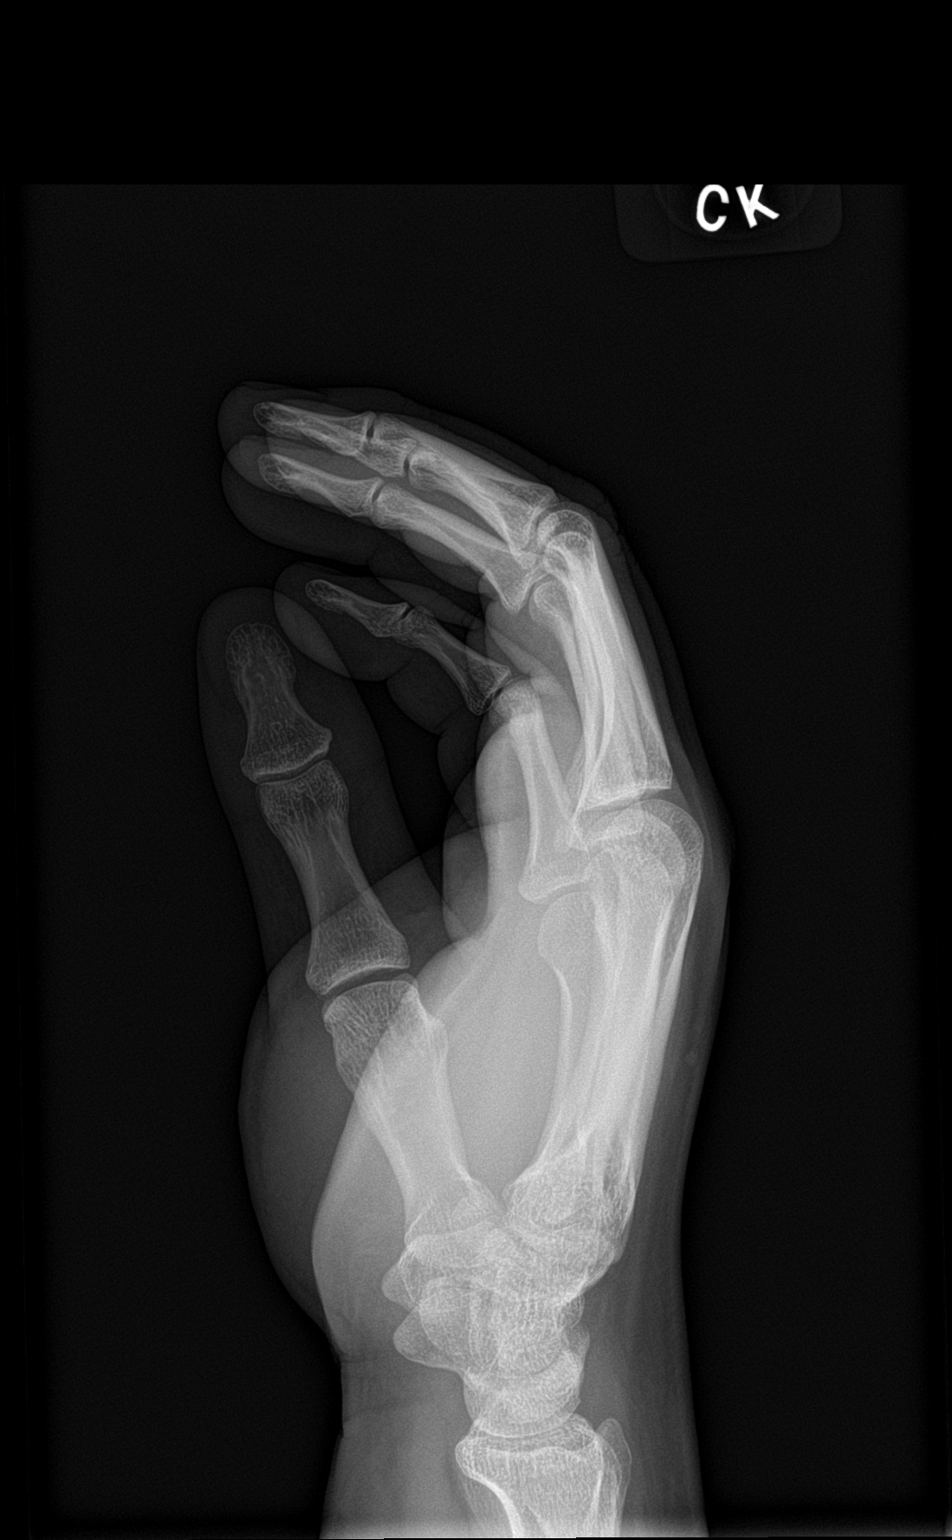

[3 of 3 positions shown; findings below may reference images not displayed]

FINDINGS: There is no evidence of fracture or dislocation. There is no
evidence of arthropathy or other focal bone abnormality. Soft
tissues are unremarkable.
IMPRESSION: Negative.

## 2020-02-23 ENCOUNTER — Encounter (HOSPITAL_COMMUNITY): Payer: Self-pay

## 2020-02-23 ENCOUNTER — Ambulatory Visit (HOSPITAL_COMMUNITY)
Admission: EM | Admit: 2020-02-23 | Discharge: 2020-02-23 | Disposition: A | Discharge status: Home / Self Care | Payer: Self-pay | Attending: Family Medicine | Admitting: Family Medicine

## 2020-02-23 DIAGNOSIS — L255 Unspecified contact dermatitis due to plants, except food: Secondary | ICD-10-CM

## 2020-02-23 MED ORDER — DEXAMETHASONE SODIUM PHOSPHATE 10 MG/ML IJ SOLN
INTRAMUSCULAR | Status: AC
  Filled 2020-02-23: qty 1

## 2020-02-23 MED ORDER — HYDROXYZINE HCL 25 MG PO TABS: 25.0000 mg | ORAL_TABLET | Freq: Three times a day (TID) | ORAL | 0 refills | Status: AC | PRN

## 2020-02-23 MED ORDER — PREDNISONE 10 MG (21) PO TBPK: ORAL_TABLET | Freq: Every day | ORAL | 0 refills | Status: AC

## 2020-02-23 MED ORDER — DEXAMETHASONE SODIUM PHOSPHATE 10 MG/ML IJ SOLN
10.0000 mg | Freq: Once | INTRAMUSCULAR | Status: AC
  Administered 2020-02-23: 10 mg via INTRAMUSCULAR

## 2020-02-23 NOTE — ED Triage Notes (Addendum)
Pt presents to UC for posion ivy on arms, stomach, and legs x3 days. Pt states rash is itchy, with out drainage. Pt has been treating with "posion ivy cream" with out relief.

## 2020-02-23 NOTE — ED Provider Notes (Signed)
[facility]   [id number] 02/23/20 Arrival Time: 1835  ASSESSMENT & PLAN:  1. Rhus dermatitis     Meds ordered this encounter  Medications  . dexamethasone (DECADRON) injection 10 mg  . hydrOXYzine (ATARAX/VISTARIL) 25 MG tablet    Sig: Take 1 tablet (25 mg total) by mouth every 8 (eight) hours as needed.    Dispense:  30 tablet    Refill:  0  . predniSONE (STERAPRED UNI-PAK 21 TAB) 10 MG (21) TBPK tablet    Sig: Take by mouth daily. Take as directed.    Dispense:  21 tablet    Refill:  0    Will follow up with PCP or here if worsening or failing to improve as anticipated. Reviewed expectations re: course of current medical issues. Questions answered. Outlined signs and symptoms indicating need for more acute intervention. Patient verbalized understanding. After Visit Summary given.   SUBJECTIVE:  Austin Mcfarland is a 24 y.o. male who presents with a skin complaint. "Poison ivy". Several days. Abdomen, legs, arms mainly. Significant itching. OTC cream without relief.   OBJECTIVE: Vitals:   02/23/20 1914  BP: 118/62  Pulse: 78  Resp: 16  Temp: 98.1 F (36.7 C)  TempSrc: Oral  SpO2: 100%    General appearance: alert; no distress HEENT: [place]; AT Neck: supple with FROM Lungs: clear to auscultation bilaterally Heart: regular rate and rhythm Extremities: no edema; moves all extremities normally Skin: warm and dry; areas of linear papules and vesicles with surrounding erythema over arms, abdomen, legs Psychological: alert and cooperative; normal mood and affect  No Known Allergies  History reviewed. No pertinent past medical history. Social History   Socioeconomic History  . Marital status: Married    Spouse name: Not on file  . Number of children: Not on file  . Years of education: Not on file  . Highest education level: Not on file  Occupational History  . Not on file  Tobacco Use  . Smoking status: Never Smoker  . Smokeless tobacco: Never Used    Substance and Sexual Activity  . Alcohol use: No  . Drug use: Never  . Sexual activity: Yes    Birth control/protection: None    Comment: Married  Other Topics Concern  . Not on file  Social History Narrative  . Not on file   Social Determinants of Health   Financial Resource Strain:   . Difficulty of Paying Living Expenses:   Food Insecurity:   . Worried About Programme researcher, broadcasting/film/video in the Last Year:   . Barista in the Last Year:   Transportation Needs:   . Freight forwarder (Medical):   Marland Kitchen Lack of Transportation (Non-Medical):   Physical Activity:   . Days of Exercise per Week:   . Minutes of Exercise per Session:   Stress:   . Feeling of Stress :   Social Connections:   . Frequency of Communication with Friends and Family:   . Frequency of Social Gatherings with Friends and Family:   . Attends Religious Services:   . Active Member of Clubs or Organizations:   . Attends Banker Meetings:   Marland Kitchen Marital Status:   Intimate Partner Violence:   . Fear of Current or Ex-Partner:   . Emotionally Abused:   Marland Kitchen Physically Abused:   . Sexually Abused:    Family History  Problem Relation Age of Onset  . Healthy Mother   . Healthy Father  History reviewed. No pertinent surgical history.   Mardella Layman, MD 02/23/20 [phone]
